# Patient Record
Sex: Male | Born: 1990 | Race: White | Hispanic: No | Marital: Single | Smoking: Never smoker
Health system: Southern US, Community
[De-identification: ages and names within clinical notes are randomized; demographics above are authoritative.]

---
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
## 2014-08-06 ENCOUNTER — Encounter (HOSPITAL_BASED_OUTPATIENT_CLINIC_OR_DEPARTMENT_OTHER): Payer: Self-pay | Admitting: Emergency Medicine

## 2014-08-06 ENCOUNTER — Emergency Department (HOSPITAL_BASED_OUTPATIENT_CLINIC_OR_DEPARTMENT_OTHER)
Admission: EM | Admit: 2014-08-06 | Discharge: 2014-08-06 | Disposition: A | Discharge status: Home / Self Care | Attending: Emergency Medicine | Admitting: Emergency Medicine

## 2014-08-06 DIAGNOSIS — Y9389 Activity, other specified: Secondary | ICD-10-CM | POA: Diagnosis not present

## 2014-08-06 DIAGNOSIS — S61209A Unspecified open wound of unspecified finger without damage to nail, initial encounter: Secondary | ICD-10-CM | POA: Insufficient documentation

## 2014-08-06 DIAGNOSIS — Y929 Unspecified place or not applicable: Secondary | ICD-10-CM | POA: Insufficient documentation

## 2014-08-06 DIAGNOSIS — W268XXA Contact with other sharp object(s), not elsewhere classified, initial encounter: Secondary | ICD-10-CM | POA: Insufficient documentation

## 2014-08-06 DIAGNOSIS — Z23 Encounter for immunization: Secondary | ICD-10-CM | POA: Diagnosis not present

## 2014-08-06 DIAGNOSIS — S61011A Laceration without foreign body of right thumb without damage to nail, initial encounter: Secondary | ICD-10-CM

## 2014-08-06 MED ORDER — TETANUS-DIPHTH-ACELL PERTUSSIS 5-2.5-18.5 LF-MCG/0.5 IM SUSP
0.5000 mL | Freq: Once | INTRAMUSCULAR | Status: AC
  Administered 2014-08-06: 0.5 mL via INTRAMUSCULAR
  Filled 2014-08-06: qty 0.5

## 2014-08-06 NOTE — Discharge Instructions (Signed)

## 2014-08-06 NOTE — ED Provider Notes (Signed)
CSN: [id number]     Arrival date & time 08/06/14  0820 History   First MD Initiated Contact with Patient 08/06/14 [phone]     Chief Complaint  Patient presents with  . Laceration     (Consider location/radiation/quality/duration/timing/severity/associated sxs/prior Treatment) HPI  This is a 23 year old male with no significant past medical history who presents a laceration to the right some. Patient reports that he was opening a can this morning when he sliced his right thumb. He is right-handed. This happened approximately 2 hours prior to arrival. Bleeding has persisted. Denies any other injury. Reports 2/10 pain  History reviewed. No pertinent past medical history. History reviewed. No pertinent past surgical history. No family history on file. History  Substance Use Topics  . Smoking status: Never Smoker   . Smokeless tobacco: Not on file  . Alcohol Use: Yes    Review of Systems  Musculoskeletal:       Thumb pain  Skin: Positive for wound.  All other systems reviewed and are negative.     Allergies  Sulfa antibiotics  Home Medications   Prior to Admission medications   Not on File   BP 125/81  Pulse 67  Temp(Src) 98 F (36.7 C) (Oral)  Resp 18  Ht  (1.778 m)  Wt 180 lb (81.647 kg)  BMI 25.83 kg/m2  SpO2 100% Physical Exam  Nursing note and vitals reviewed. Constitutional: He is oriented to person, place, and time. He appears well-developed and well-nourished.  HENT:  Head: Normocephalic and atraumatic.  Cardiovascular: Normal rate and regular rhythm.   Pulmonary/Chest: Effort normal. No respiratory distress.  Musculoskeletal:  There is a 3 cm laceration over the palmar aspect of the right thumb just distal to the interphalangeal joint, flexion and extension are intact, neurovascularly intact distally with good sensation  Neurological: He is alert and oriented to person, place, and time.  Skin: Skin is warm and dry.  Psychiatric: He has a normal mood  and affect.    ED Course  LACERATION REPAIR Date/Time: 08/06/2014 9:16 AM Performed by: Ross Marcus, F Authorized by: Ross Marcus, F Consent: Verbal consent obtained. Risks and benefits: risks, benefits and alternatives were discussed Consent given by: patient Body area: upper extremity Location details: right thumb Laceration length: 3 cm Foreign bodies: no foreign bodies Tendon involvement: none Nerve involvement: none Vascular damage: no Anesthesia: local infiltration and digital block Local anesthetic: lidocaine 2% without epinephrine Anesthetic total: 3 ml Patient sedated: no Preparation: Patient was prepped and draped in the usual sterile fashion. Irrigation solution: saline Amount of cleaning: standard Debridement: none Degree of undermining: none Skin closure: 4-0 nylon Number of sutures: 3 Technique: simple Approximation: close Approximation difficulty: simple Dressing: antibiotic ointment and 4x4 sterile gauze Patient tolerance: Patient tolerated the procedure well with no immediate complications.   (including critical care time) Labs Review Labs Reviewed - No data to display  Imaging Review No results found.   EKG Interpretation None      MDM   Final diagnoses:  Thumb laceration, right, initial encounter    Patient presents with a laceration to the right thumb. Last tetanus was 2007. Tetanus updated. Laceration was repaired at the bedside without complication. Patient to followup in 7 days for suture removal.  After history, exam, and medical workup I feel the patient has been appropriately medically screened and is safe for discharge home. Pertinent diagnoses were discussed with the patient. Patient was given return precautions.     Shon Baton,  MD 08/06/14 [phone]

## 2014-08-06 NOTE — ED Notes (Signed)
Suture cart is set up and ready for the doctor to use. 

## 2014-08-06 NOTE — ED Notes (Signed)
Pt reports lac to right thumb from opening a can this am. Last tetanus 2007.

## 2021-05-14 ENCOUNTER — Encounter (HOSPITAL_COMMUNITY): Payer: Self-pay

## 2021-05-14 ENCOUNTER — Ambulatory Visit (HOSPITAL_COMMUNITY)
Admission: EM | Admit: 2021-05-14 | Discharge: 2021-05-14 | Disposition: A | Discharge status: Home / Self Care | Payer: 59 | Attending: Urgent Care | Admitting: Urgent Care

## 2021-05-14 ENCOUNTER — Other Ambulatory Visit: Payer: Self-pay

## 2021-05-14 DIAGNOSIS — L509 Urticaria, unspecified: Secondary | ICD-10-CM | POA: Diagnosis not present

## 2021-05-14 DIAGNOSIS — L500 Allergic urticaria: Secondary | ICD-10-CM | POA: Diagnosis not present

## 2021-05-14 MED ORDER — HYDROXYZINE HCL 25 MG PO TABS: 12.5000 mg | ORAL_TABLET | Freq: Three times a day (TID) | ORAL | 0 refills | Status: AC | PRN

## 2021-05-14 NOTE — ED Triage Notes (Signed)
Pt in with c/o rash on right arm that he noticed today which spread to the back of both legs  Pt states he took benadryl with some relief   Pt also c/o pain in right bicep that started Friday after he went to the gym  States he's noticed some swelling

## 2021-05-14 NOTE — ED Provider Notes (Signed)
  [facility]   MRN: [id number] DOB: [birth year]  Subjective:   Brandon Huynh is a 30 y.o. male presenting for acute onset of hives over the back of his legs and to a lesser degree on his arms. Denies eating any new foods, starting new medications, exposure to poisonous plants, new hygiene products, new cleaning products or detergents. Took some benadryl with minimal relief.   No current facility-administered medications for this encounter. No current outpatient medications on file.   Allergies  Allergen Reactions   Sulfa Antibiotics     History reviewed. No pertinent past medical history.   Past Surgical History:  Procedure Laterality Date   HAND SURGERY      History reviewed. No pertinent family history.  Social History   Tobacco Use   Smoking status: Never   Smokeless tobacco: Never  Substance Use Topics   Alcohol use: Yes   Drug use: No    ROS   Objective:   Vitals: BP 129/77 (BP Location: Left Arm)   Pulse 91   Temp 98.2 F (36.8 C) (Oral)   Resp 16   SpO2 96%   Physical Exam Constitutional:      General: He is not in acute distress.    Appearance: Normal appearance. He is well-developed and normal weight. He is not ill-appearing, toxic-appearing or diaphoretic.  HENT:     Head: Normocephalic and atraumatic.     Right Ear: External ear normal.     Left Ear: External ear normal.     Nose: Nose normal.     Mouth/Throat:     Mouth: Mucous membranes are moist.     Pharynx: Oropharynx is clear.     Comments: Airway is patent. Patient speaking in full sentences, controlling secretions. Eyes:     General: No scleral icterus.       Right eye: No discharge.        Left eye: No discharge.     Extraocular Movements: Extraocular movements intact.     Pupils: Pupils are equal, round, and reactive to light.  Cardiovascular:     Rate and Rhythm: Normal rate and regular rhythm.     Heart sounds: Normal heart sounds. No murmur heard.   No  friction rub. No gallop.  Pulmonary:     Effort: Pulmonary effort is normal. No respiratory distress.     Breath sounds: Normal breath sounds. No stridor. No wheezing, rhonchi or rales.  Musculoskeletal:     Cervical back: Normal range of motion.  Skin:    General: Skin is warm and dry.     Findings: Rash (urticaria over posterior thighs and scantly over his upper extremities) present.  Neurological:     Mental Status: He is alert and oriented to person, place, and time.  Psychiatric:        Mood and Affect: Mood normal.        Behavior: Behavior normal.        Thought Content: Thought content normal.        Judgment: Judgment normal.      Assessment and Plan :   PDMP not reviewed this encounter.  1. Hives   2. Allergic urticaria     Urticaria due to unknown offending agent. Recommended conservative management, start hydroxyzine. No suspicion for developing anaphylactic reaction. Counseled patient on potential for adverse effects with medications prescribed/recommended today, ER and return-to-clinic precautions discussed, patient verbalized understanding.    Wallis Bamberg, [place] 05/17/21 [phone]

## 2021-05-15 ENCOUNTER — Other Ambulatory Visit: Payer: Self-pay

## 2021-05-15 ENCOUNTER — Encounter (HOSPITAL_COMMUNITY): Payer: Self-pay

## 2021-05-15 ENCOUNTER — Ambulatory Visit (HOSPITAL_COMMUNITY)
Admission: EM | Admit: 2021-05-15 | Discharge: 2021-05-15 | Disposition: A | Discharge status: Home / Self Care | Payer: 59 | Attending: Nurse Practitioner | Admitting: Nurse Practitioner

## 2021-05-15 DIAGNOSIS — L03113 Cellulitis of right upper limb: Secondary | ICD-10-CM

## 2021-05-15 MED ORDER — CEPHALEXIN 500 MG PO CAPS: 500.0000 mg | ORAL_CAPSULE | Freq: Three times a day (TID) | ORAL | 0 refills | Status: AC

## 2021-05-15 MED ORDER — IBUPROFEN 600 MG PO TABS: 600.0000 mg | ORAL_TABLET | Freq: Four times a day (QID) | ORAL | 0 refills | Status: AC | PRN

## 2021-05-15 NOTE — Discharge Instructions (Addendum)
Cellulitis is a skin infection.  The area is often warm, red, swollen, and sore. Finish all the antibiotics even if you start to get better  Follow-up if you do not start to get better after 2 days of treatment.

## 2021-05-15 NOTE — ED Provider Notes (Signed)
[facility]    CSN: [id number] Arrival date & time: 05/15/21  1359      History   Chief Complaint Chief Complaint  Patient presents with   Arm Injury    HPI Brandon Huynh is a 30 y.o. male.   Subjective:   Brandon Huynh is a 30 y.o. male who presents for evaluation of a possible skin infection located to the ventral aspect of the upper extremity. Onset of symptoms was abrupt starting 2 days ago and has been unchanged course since that time. Symptoms include soreness, erythema and warmth. Patient denies chills or fevers. Symptoms started 1 day after he worked out at [employer]  He did bilateral upper arm work at that time.  However, current symptoms are limited to just the right upper arm.  Patient initially thought that it could be a muscle strain so he treated it with ice and elevation; however, the area started to get warm and more tender which prompted him to seek evaluation.    The following portions of the patient's history were reviewed and updated as appropriate: allergies, current medications, past family history, past medical history, past social history, past surgical history, and problem list.        History reviewed. No pertinent past medical history.  There are no problems to display for this patient.   Past Surgical History:  Procedure Laterality Date   HAND SURGERY         Home Medications    Prior to Admission medications   Medication Sig Start Date End Date Taking? Authorizing Provider  cephALEXin (KEFLEX) 500 MG capsule Take 1 capsule (500 mg total) by mouth 3 (three) times daily for 5 days. 05/15/21 05/20/21 Yes Lurline Idol, FNP  ibuprofen (ADVIL) 600 MG tablet Take 1 tablet (600 mg total) by mouth every 6 (six) hours as needed. 05/15/21  Yes Lurline Idol, FNP  hydrOXYzine (ATARAX/VISTARIL) 25 MG tablet Take 0.5-1 tablets (12.5-25 mg total) by mouth every 8 (eight) hours as needed for itching. 05/14/21   Wallis Bamberg, PA-C     Family History History reviewed. No pertinent family history.  Social History Social History   Tobacco Use   Smoking status: Never   Smokeless tobacco: Never  Vaping Use   Vaping Use: Never used  Substance Use Topics   Alcohol use: Yes   Drug use: No     Allergies   Sulfa antibiotics   Review of Systems Review of Systems  Constitutional:  Negative for fever.  Musculoskeletal:  Positive for myalgias. Negative for arthralgias.  Skin:  Positive for color change.    Physical Exam Triage Vital Signs ED Triage Vitals  Enc Vitals Group     BP 05/15/21 1540 135/78     Pulse Rate 05/15/21 1539 87     Resp 05/15/21 1539 20     Temp 05/15/21 1540 98.2 F (36.8 C)     Temp Source 05/15/21 1540 Oral     SpO2 05/15/21 1539 98 %     Weight --      Height --      Head Circumference --      Peak Flow --      Pain Score 05/15/21 1541 2     Pain Loc --      Pain Edu? --      Excl. in GC? --    No data found.  Updated Vital Signs BP 135/78 (BP Location: Right Arm)   Pulse 87  Temp 98.2 F (36.8 C) (Oral)   Resp 20   SpO2 99%   Visual Acuity Right Eye Distance:   Left Eye Distance:   Bilateral Distance:    Right Eye Near:   Left Eye Near:    Bilateral Near:     Physical Exam Vitals reviewed.  Constitutional:      Appearance: Normal appearance.  HENT:     Head: Normocephalic.  Cardiovascular:     Rate and Rhythm: Normal rate.  Pulmonary:     Effort: Pulmonary effort is normal.  Musculoskeletal:        General: Normal range of motion.     Cervical back: Normal range of motion and neck supple.  Lymphadenopathy:     Cervical: No cervical adenopathy.  Skin:    General: Skin is warm and dry.          Comments: Warmth, swelling and erythema noted to the ventral aspect of the right upper arm.  Full range of motion of the extremity.  Full strength and sensation noted.  Neurovascularly intact.  Neurological:     General: No focal deficit present.      Mental Status: He is alert and oriented to person, place, and time.  Psychiatric:        Mood and Affect: Mood normal.        Behavior: Behavior normal.     UC Treatments / Results  Labs (all labs ordered are listed, but only abnormal results are displayed) Labs Reviewed - No data to display  EKG   Radiology No results found.  Procedures Procedures (including critical care time)  Medications Ordered in UC Medications - No data to display  Initial Impression / Assessment and Plan / UC Course  I have reviewed the triage vital signs and the nursing notes.  Pertinent labs & imaging results that were available during my care of the patient were reviewed by me and considered in my medical decision making (see chart for details).     30 year old male presenting with swelling, erythema and warmth to the right upper arm after working out a couple of days ago.  Patient is afebrile.  Nontoxic.  Symptoms could likely be due to overuse/muscle strain; however, due to the swelling, erythema and warmth of the area, will treat for possible cellulitis. Keflex prescribed. Motrin around the clock for the next couple of days then as needed. Monitor for worsening symptoms and/or development of systemic symptoms. Discussed indications for follow-up. Patient agreeable.   Today's evaluation has revealed no signs of a dangerous process. Discussed diagnosis with patient and/or guardian. Patient and/or guardian aware of their diagnosis, possible red flag symptoms to watch out for and need for close follow up. Patient and/or guardian understands verbal and written discharge instructions. Patient and/or guardian comfortable with plan and disposition.  Patient and/or guardian has a clear mental status at this time, good insight into illness (after discussion and teaching) and has clear judgment to make decisions regarding their care  This care was provided during an unprecedented National Emergency due to the  Novel Coronavirus (COVID-19) pandemic. COVID-19 infections and transmission risks place heavy strains on healthcare resources.  As this pandemic evolves, our facility, providers, and staff strive to respond fluidly, to remain operational, and to provide care relative to available resources and information. Outcomes are unpredictable and treatments are without well-defined guidelines. Further, the impact of COVID-19 on all aspects of urgent care, including the impact to patients seeking care for reasons other than  COVID-19, is unavoidable during this national emergency. At this time of the global pandemic, management of patients has significantly changed, even for non-COVID positive patients given high local and regional COVID volumes at this time requiring high healthcare system and resource utilization. The standard of care for management of both COVID suspected and non-COVID suspected patients continues to change rapidly at the local, regional, national, and global levels. This patient was worked up and treated to the best available but ever changing evidence and resources available at this current time.   Documentation was completed with the aid of voice recognition software. Transcription may contain typographical errors. Final Clinical Impressions(s) / UC Diagnoses   Final diagnoses:  Cellulitis of right upper extremity     Discharge Instructions      Cellulitis is a skin infection.  The area is often warm, red, swollen, and sore. Finish all the antibiotics even if you start to get better  Follow-up if you do not start to get better after 2 days of treatment.     ED Prescriptions     Medication Sig Dispense Auth. Provider   cephALEXin (KEFLEX) 500 MG capsule Take 1 capsule (500 mg total) by mouth 3 (three) times daily for 5 days. 15 capsule Lurline Idol, FNP   ibuprofen (ADVIL) 600 MG tablet Take 1 tablet (600 mg total) by mouth every 6 (six) hours as needed. 30 tablet Lurline Idol, FNP      PDMP not reviewed this encounter.   Lurline Idol, [place] 05/15/21 1624

## 2021-05-15 NOTE — ED Triage Notes (Signed)
Pt reports R bicep swelling x 2 days (since Saturday).  No known injury but did work out the day before.  Reports tightness and pain in bicep when extending arm.
